# Patient Record
Sex: Female | Born: 1990 | Race: White | Hispanic: No | Marital: Single | Smoking: Never smoker
Health system: Southern US, Community
[De-identification: ages and names within clinical notes are randomized; demographics above are authoritative.]

## PROBLEM LIST (undated history)

## (undated) DIAGNOSIS — J45909 Unspecified asthma, uncomplicated: Secondary | ICD-10-CM

## (undated) DIAGNOSIS — L309 Dermatitis, unspecified: Secondary | ICD-10-CM

## (undated) HISTORY — PX: HAND SURGERY: SHX662

---

## 2011-01-16 ENCOUNTER — Inpatient Hospital Stay (INDEPENDENT_AMBULATORY_CARE_PROVIDER_SITE_OTHER)
Admission: RE | Admit: 2011-01-16 | Discharge: 2011-01-16 | Disposition: A | Discharge status: Home / Self Care | Payer: BC Managed Care – PPO | Source: Ambulatory Visit | Attending: Emergency Medicine | Admitting: Emergency Medicine

## 2011-01-16 DIAGNOSIS — G43009 Migraine without aura, not intractable, without status migrainosus: Secondary | ICD-10-CM

## 2011-03-23 ENCOUNTER — Emergency Department (HOSPITAL_COMMUNITY)
Admission: EM | Admit: 2011-03-23 | Discharge: 2011-03-23 | Disposition: A | Discharge status: Home / Self Care | Payer: BC Managed Care – PPO | Attending: Emergency Medicine | Admitting: Emergency Medicine

## 2011-03-23 ENCOUNTER — Emergency Department (HOSPITAL_COMMUNITY): Payer: BC Managed Care – PPO

## 2011-03-23 DIAGNOSIS — R109 Unspecified abdominal pain: Secondary | ICD-10-CM | POA: Insufficient documentation

## 2011-03-23 DIAGNOSIS — K5900 Constipation, unspecified: Secondary | ICD-10-CM | POA: Insufficient documentation

## 2011-03-23 LAB — BASIC METABOLIC PANEL
BUN: 9 mg/dL (ref 6–23)
Calcium: 8.7 mg/dL (ref 8.4–10.5)
Creatinine, Ser: 0.64 mg/dL (ref 0.50–1.10)
GFR calc non Af Amer: >60 mL/min (ref >60)
Glucose, Bld: 109 mg/dL — ABNORMAL HIGH (ref 70–99)
Sodium: 134 mEq/L — ABNORMAL LOW (ref 135–145)

## 2011-03-23 LAB — URINE MICROSCOPIC-ADD ON

## 2011-03-23 LAB — URINALYSIS, ROUTINE W REFLEX MICROSCOPIC
Glucose, UA: NEGATIVE mg/dL
Hgb urine dipstick: NEGATIVE
Ketones, ur: NEGATIVE mg/dL
Protein, ur: NEGATIVE mg/dL
pH: 8 (ref 5.0–8.0)

## 2011-03-23 LAB — DIFFERENTIAL
Basophils Relative: 0 % (ref 0–1)
Lymphs Abs: 1.2 10*3/uL (ref 0.7–4.0)
Monocytes Absolute: 0.4 10*3/uL (ref 0.1–1.0)
Monocytes Relative: 7 % (ref 3–12)
Neutro Abs: 4.5 10*3/uL (ref 1.7–7.7)

## 2011-03-23 LAB — CBC
HCT: 35.1 % — ABNORMAL LOW (ref 36.0–46.0)
Hemoglobin: 12.7 g/dL (ref 12.0–15.0)
MCH: 35.1 pg — ABNORMAL HIGH (ref 26.0–34.0)
MCHC: 36.2 g/dL — ABNORMAL HIGH (ref 30.0–36.0)
MCV: 97 fL (ref 78.0–100.0)

## 2012-09-03 IMAGING — CR DG ABDOMEN ACUTE W/ 1V CHEST
3 series · 3 of 3 positions shown · non-contrast
Comparison: None.

CLINICAL DATA: Abdominal pain.

ACUTE ABDOMEN SERIES (ABDOMEN 2 VIEW & CHEST 1 VIEW)

[w chest pa]
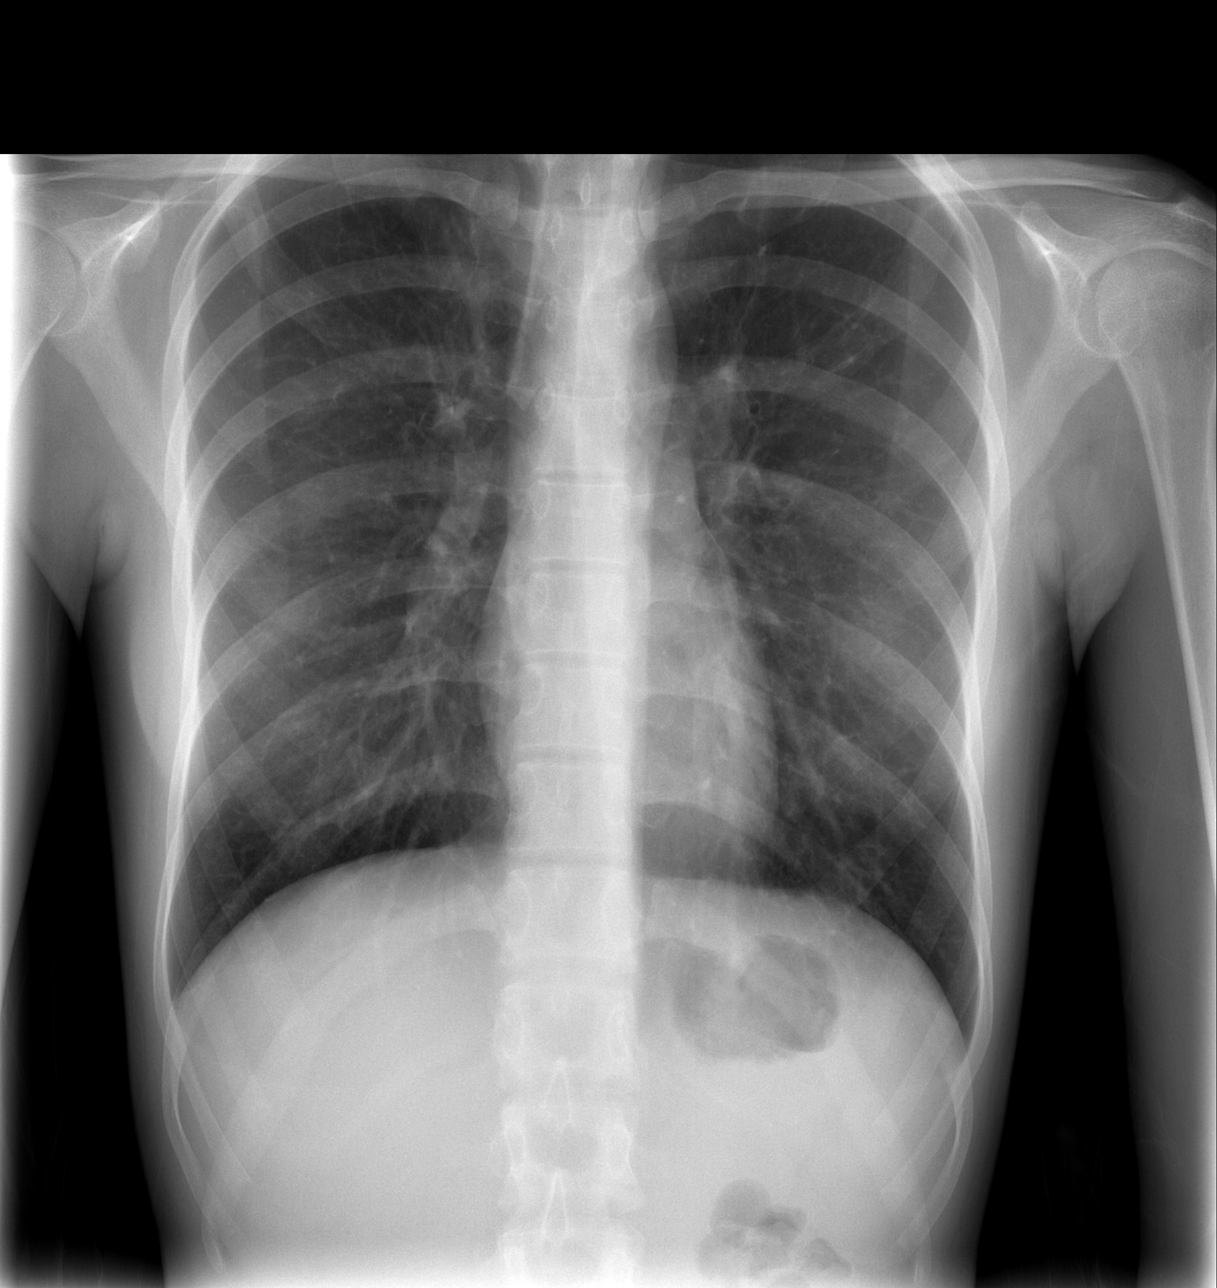

[w abdomen upright]
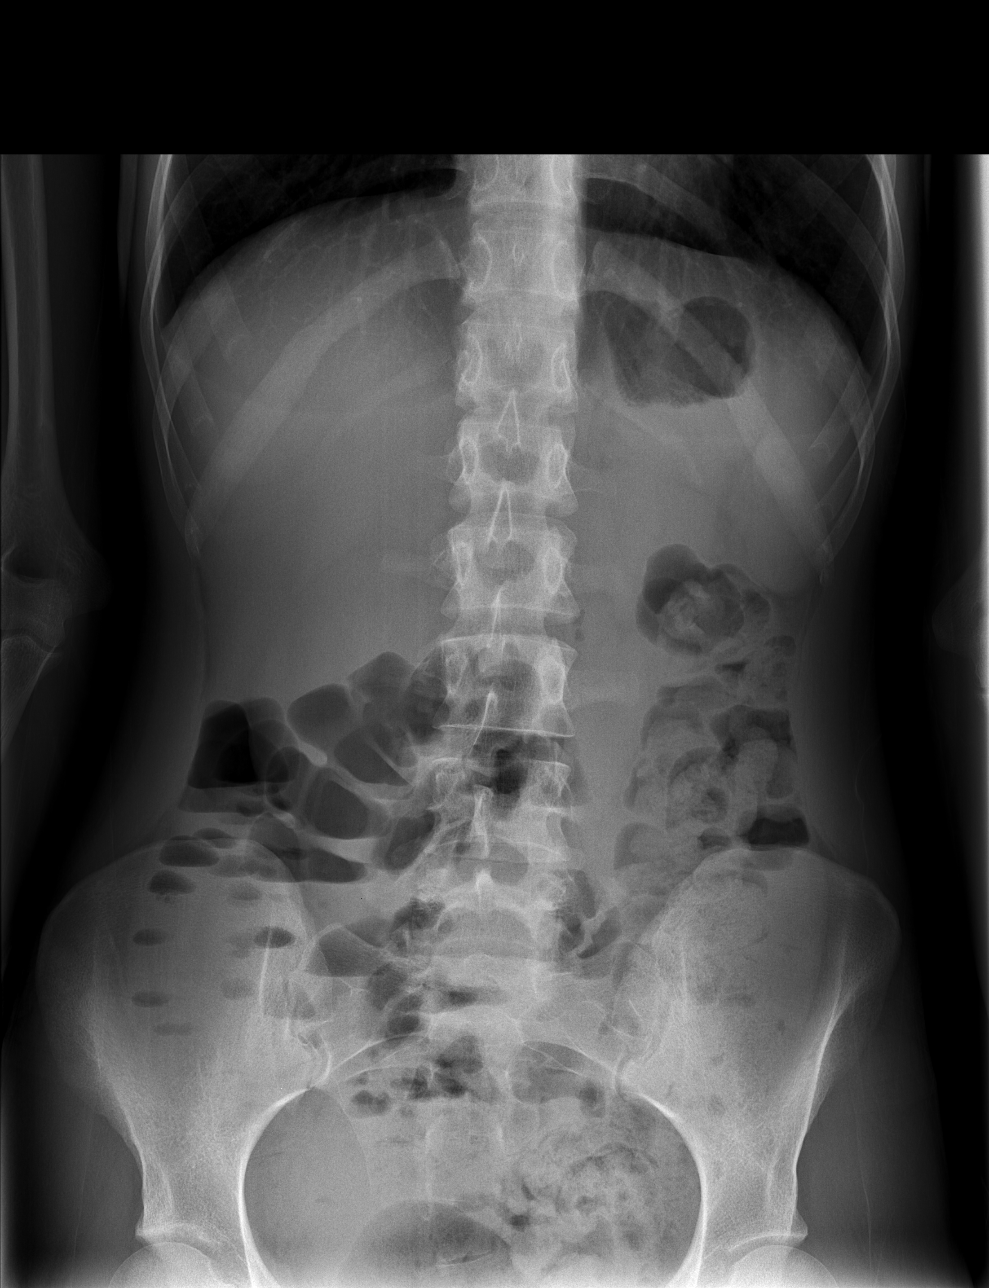

[t abdomen supine]
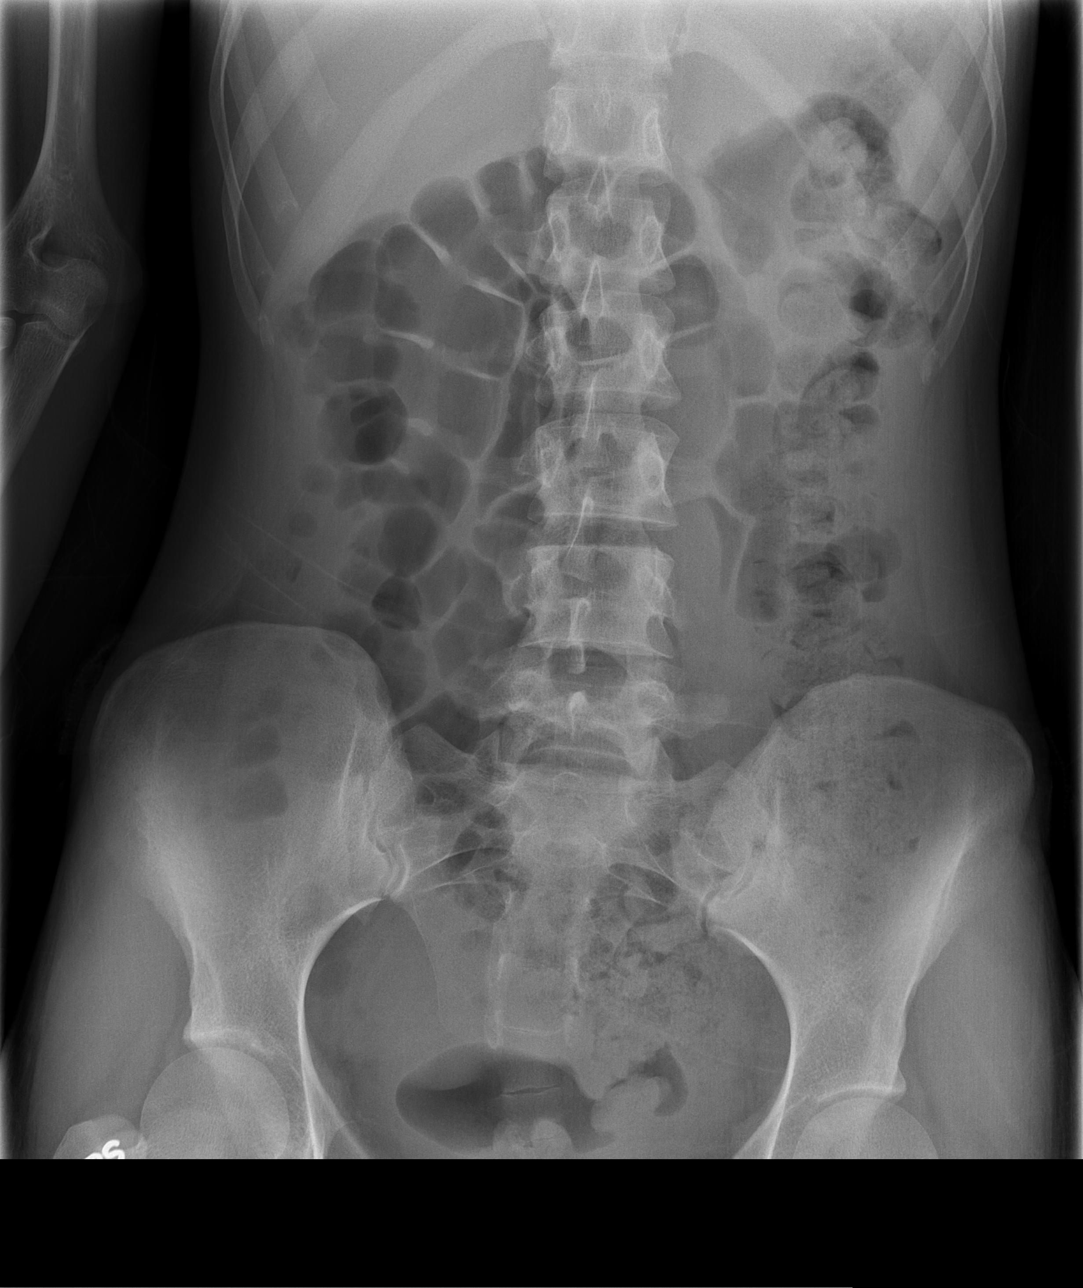

[3 of 3 positions shown; findings below may reference images not displayed]

FINDINGS: Moderate stool in the left side of the colon.  Mild
gaseous distention of the colon.  Free air, organomegaly or
suspicious calcification.

Heart and mediastinal contours are within normal limits.  No focal
opacities or effusions.  No acute bony abnormality.
IMPRESSION: No obstruction or free air.  Moderate stool burden.

## 2013-03-05 ENCOUNTER — Emergency Department (HOSPITAL_COMMUNITY)
Admission: EM | Admit: 2013-03-05 | Discharge: 2013-03-05 | Disposition: A | Discharge status: Home / Self Care | Payer: BC Managed Care – PPO | Source: Home / Self Care

## 2013-03-05 ENCOUNTER — Encounter (HOSPITAL_COMMUNITY): Payer: Self-pay | Admitting: Emergency Medicine

## 2013-03-05 DIAGNOSIS — N390 Urinary tract infection, site not specified: Secondary | ICD-10-CM

## 2013-03-05 LAB — POCT URINALYSIS DIP (DEVICE)
Bilirubin Urine: NEGATIVE
Glucose, UA: NEGATIVE mg/dL
Hgb urine dipstick: NEGATIVE
Nitrite: NEGATIVE
Protein, ur: 30 mg/dL — AB
Specific Gravity, Urine: >=1.03 (ref 1.005–1.030)
Urobilinogen, UA: 0.2 mg/dL (ref 0.0–1.0)
pH: 5.5 (ref 5.0–8.0)

## 2013-03-05 MED ORDER — CEPHALEXIN 500 MG PO CAPS: 500.0000 mg | ORAL_CAPSULE | Freq: Four times a day (QID) | ORAL | Status: DC

## 2013-03-05 NOTE — ED Provider Notes (Addendum)
History     CSN: [id number]  Arrival date & time 03/05/13  1659   None     Chief Complaint  Patient presents with  . Urinary Tract Infection    frequency and irrittation x 1 wk.     (Consider location/radiation/quality/duration/timing/severity/associated sxs/prior treatment) Patient is a 22 y.o. female presenting with urinary tract infection. The history is provided by the patient.  Urinary Tract Infection This is a recurrent problem. The current episode started more than 1 week ago. The problem has not changed since onset.Pertinent negatives include no chest pain and no abdominal pain.    History reviewed. No pertinent past medical history.  Past Surgical History  Procedure Laterality Date  . Hand surgery      History reviewed. No pertinent family history.  History  Substance Use Topics  . Smoking status: Never Smoker   . Smokeless tobacco: Not on file  . Alcohol Use: Yes    OB History   Grav Para Term Preterm Abortions TAB SAB Ect Mult Living                  Review of Systems  Constitutional: Negative.   Cardiovascular: Negative for chest pain.  Gastrointestinal: Negative for abdominal pain.  Genitourinary: Positive for dysuria, urgency and frequency. Negative for hematuria, vaginal bleeding, vaginal discharge and menstrual problem.    Allergies  Review of patient's allergies indicates no known allergies.  Home Medications   Current Outpatient Rx  Name  Route  Sig  Dispense  Refill  . cephALEXin (KEFLEX) 500 MG capsule   Oral   Take 1 capsule (500 mg total) by mouth 4 (four) times daily. Take all of medicine and drink lots of fluids   20 capsule   0     BP 95/68  Pulse 78  Temp(Src) 98.1 F (36.7 C) (Oral)  Resp 16  SpO2 100%  LMP 03/01/2013  Physical Exam  Nursing note and vitals reviewed. Constitutional: She is oriented to person, place, and time. She appears well-developed and well-nourished.  Neck: Normal range of motion. Neck supple.   Abdominal: Soft. Bowel sounds are normal. There is no tenderness.  Lymphadenopathy:    She has no cervical adenopathy.  Neurological: She is alert and oriented to person, place, and time.  Skin: Skin is warm and dry.    ED Course  Procedures (including critical care time)  Labs Reviewed  POCT URINALYSIS DIP (DEVICE) - Abnormal; Notable for the following:    Ketones, ur TRACE (*)    Protein, ur 30 (*)    Leukocytes, UA TRACE (*)    All other components within normal limits   No results found.   1. UTI (lower urinary tract infection)       MDM  U/a abnl        Linna Hoff, MD 03/05/13 1743  Linna Hoff, MD 03/05/13 1744

## 2013-03-05 NOTE — ED Notes (Signed)
Pt c/o frequent urination and irritation x 1 wk. Mild nausea. Denies pelvic or abdominal pain.  Pt has increased water intake and is drinking cranberry juice with no relief in symptoms.

## 2016-05-25 ENCOUNTER — Encounter (HOSPITAL_COMMUNITY): Payer: Self-pay | Admitting: Family Medicine

## 2016-05-25 ENCOUNTER — Ambulatory Visit (HOSPITAL_COMMUNITY)
Admission: EM | Admit: 2016-05-25 | Discharge: 2016-05-25 | Disposition: A | Discharge status: Home / Self Care | Payer: Self-pay | Attending: Family Medicine | Admitting: Family Medicine

## 2016-05-25 DIAGNOSIS — Z87440 Personal history of urinary (tract) infections: Secondary | ICD-10-CM | POA: Insufficient documentation

## 2016-05-25 DIAGNOSIS — N390 Urinary tract infection, site not specified: Secondary | ICD-10-CM | POA: Insufficient documentation

## 2016-05-25 LAB — POCT URINALYSIS DIP (DEVICE)
BILIRUBIN URINE: NEGATIVE
Glucose, UA: 100 mg/dL — AB
HGB URINE DIPSTICK: NEGATIVE
Ketones, ur: NEGATIVE mg/dL
Leukocytes, UA: NEGATIVE
NITRITE: POSITIVE — AB
PH: 6 (ref 5.0–8.0)
PROTEIN: 30 mg/dL — AB
Specific Gravity, Urine: 1.015 (ref 1.005–1.030)
UROBILINOGEN UA: 1 mg/dL (ref 0.0–1.0)

## 2016-05-25 MED ORDER — CIPROFLOXACIN HCL 500 MG PO TABS: 500.0000 mg | ORAL_TABLET | Freq: Two times a day (BID) | ORAL | 0 refills | Status: AC

## 2016-05-25 MED ORDER — PHENAZOPYRIDINE HCL 200 MG PO TABS: 200.0000 mg | ORAL_TABLET | Freq: Three times a day (TID) | ORAL | 0 refills | Status: AC

## 2016-05-25 NOTE — ED Provider Notes (Signed)
[facility]    CSN: [id number] Arrival date & time: 05/25/16  1142  First Provider Contact:  None       History   Chief Complaint Chief Complaint  Patient presents with  . Recurrent UTI    HPI Miranda Velasquez is a 25 y.o. female.   This is a 25 year old woman who is here for evaluation of urinary discomfort. [facility] couple months [facility]. At that time she had back pain as well as dysuria.  She's continued to have urinary frequency, and dysuria has gotten much worse the last several days. She's tried over-the-counter Azo type medications which have not significantly reduced her symptoms.  She is in a monogamous relationship for 8 years. She has no discharge. She has no nausea, vomiting, fever, or night sweats. Periods are regular.      History reviewed. No pertinent past medical history.  There are no active problems to display for this patient.   Past Surgical History:  Procedure Laterality Date  . HAND SURGERY      OB History    No data available       Home Medications    Prior to Admission medications   Medication Sig Start Date End Date Taking? Authorizing Provider  ciprofloxacin (CIPRO) 500 MG tablet Take 1 tablet (500 mg total) by mouth 2 (two) times daily. 05/25/16   Elvina SidleKurt Isidoro Santillana, MD  phenazopyridine (PYRIDIUM) 200 MG tablet Take 1 tablet (200 mg total) by mouth 3 (three) times daily. 05/25/16   Elvina SidleKurt Virdie Penning, MD    Family History History reviewed. No pertinent family history.  Social History Social History  Substance Use Topics  . Smoking status: Never Smoker  . Smokeless tobacco: Never Used  . Alcohol use Yes     Allergies   Review of patient's allergies indicates no known allergies.   Review of Systems Review of Systems  Constitutional: Negative.   HENT: Negative.   Eyes: Negative.   Respiratory: Negative.   Cardiovascular: Negative.   Gastrointestinal: Negative.     Genitourinary: Positive for dysuria and frequency. Negative for flank pain, pelvic pain, vaginal bleeding and vaginal discharge.  Musculoskeletal: Negative.   Neurological: Negative.      Physical Exam Triage Vital Signs ED Triage Vitals  Enc Vitals Group     BP 05/25/16 1220 140/77     Pulse Rate 05/25/16 1220 98     Resp 05/25/16 1220 18     Temp 05/25/16 1220 98.6 F (37 C)     Temp Source 05/25/16 1220 Oral     SpO2 05/25/16 1220 98 %     Weight --      Height --      Head Circumference --      Peak Flow --      Pain Score 05/25/16 1221 7     Pain Loc --      Pain Edu? --      Excl. in GC? --    No data found.   Updated Vital Signs BP 140/77   Pulse 98   Temp 98.6 F (37 C) (Oral)   Resp 18   LMP 05/11/2016   SpO2 98%   Visual Acuity Right Eye Distance:   Left Eye Distance:   Bilateral Distance:    Right Eye Near:   Left Eye Near:    Bilateral Near:     Physical Exam  Constitutional: She appears well-developed and well-nourished.  Nursing note and vitals reviewed.    UC Treatments / Results  Labs (all labs ordered are listed, but only abnormal results are displayed) Labs Reviewed  POCT URINALYSIS DIP (DEVICE) - Abnormal; Notable for the following:       Result Value   Glucose, UA 100 (*)    Protein, ur 30 (*)    Nitrite POSITIVE (*)    All other components within normal limits    EKG  EKG Interpretation None       Radiology No results found.  Procedures Procedures (including critical care time)  Medications Ordered in UC Medications - No data to display   Initial Impression / Assessment and Plan / UC Course  I have reviewed the triage vital signs and the nursing notes.  Pertinent labs & imaging results that were available during my care of the patient were reviewed by me and considered in my medical decision making (see chart for details).  Clinical Course    Final Clinical Impressions(s) / UC Diagnoses   Final  diagnoses:  UTI (lower urinary tract infection)    New Prescriptions New Prescriptions   CIPROFLOXACIN (CIPRO) 500 MG TABLET    Take 1 tablet (500 mg total) by mouth 2 (two) times daily.   PHENAZOPYRIDINE (PYRIDIUM) 200 MG TABLET    Take 1 tablet (200 mg total) by mouth 3 (three) times daily.     Elvina Sidle, MD 05/25/16 1249

## 2016-05-25 NOTE — ED Triage Notes (Signed)
Pt here for reoccuring UTI symptoms. sts was treated at the end of June for UTI and never really  Went away.

## 2016-05-26 LAB — URINE CULTURE

## 2016-05-29 ENCOUNTER — Telehealth (HOSPITAL_COMMUNITY): Payer: Self-pay | Admitting: Emergency Medicine

## 2016-05-29 NOTE — Telephone Encounter (Signed)
-----   Message from Eustace MooreLaura W Murray, MD sent at 05/27/2016  1:01 PM EDT ----- Clinical staff, please let patient know that urine culture does not clearly demonstrate a UTI.   Recheck for further evaluation if symptoms persist.  LM

## 2016-05-29 NOTE — Telephone Encounter (Signed)
Called mobile number but VM was not set up.  Need to see how pt is doing and to give lab results from recent visit on 9/8 Also let pt know labs can be obtained from MyChart

## 2016-06-13 ENCOUNTER — Encounter (HOSPITAL_COMMUNITY): Payer: Self-pay | Admitting: Emergency Medicine

## 2016-06-13 ENCOUNTER — Ambulatory Visit (HOSPITAL_COMMUNITY)
Admission: EM | Admit: 2016-06-13 | Discharge: 2016-06-13 | Disposition: A | Discharge status: Home / Self Care | Payer: Self-pay | Attending: Internal Medicine | Admitting: Internal Medicine

## 2016-06-13 DIAGNOSIS — R300 Dysuria: Secondary | ICD-10-CM

## 2016-06-13 HISTORY — DX: Dermatitis, unspecified: L30.9

## 2016-06-13 HISTORY — DX: Unspecified asthma, uncomplicated: J45.909

## 2016-06-13 NOTE — Telephone Encounter (Signed)
Pt is here to be seen for similar sx today 9/27  Results given while pt was waiting.   Pt verb understanding.

## 2016-06-13 NOTE — ED Provider Notes (Signed)
CSN: [id number]     Arrival date & time 06/13/16  1354 History   First MD Initiated Contact with Patient 06/13/16 1542     Chief Complaint  Patient presents with  . Back Pain   (Consider location/radiation/quality/duration/timing/severity/associated sxs/prior Treatment) HPI Pt is a 25 y/o female with continued UTI symptoms. seh states she has been seen multiple occasions, and was told today that her urine culture is negative. At this point pt starts to yell at me. She then states that she does not need another test for her urine, that she needs to find out what is wrong with her. She states she does not have the money to visit a specialist. She then gets off the bed and walks out of the room and starts crying in the hall outside the patient rooms. She then starts yelling that her keys are missing and walks back into the room. She starts yelling that no one cares for her, we are not taking her seriously. She then demands her discharge papers with the name of a urologist.  Past Medical History:  Diagnosis Date  . Asthma   . Eczema    Past Surgical History:  Procedure Laterality Date  . HAND SURGERY     History reviewed. No pertinent family history. Social History  Substance Use Topics  . Smoking status: Never Smoker  . Smokeless tobacco: Never Used  . Alcohol use Yes   OB History    No data available     Review of Systems Unable to perform Allergies  Review of patient's allergies indicates no known allergies.  Home Medications   Prior to Admission medications   Medication Sig Start Date End Date Taking? Authorizing Provider  ciprofloxacin (CIPRO) 500 MG tablet Take 1 tablet (500 mg total) by mouth 2 (two) times daily. 05/25/16   Elvina SidleKurt Lauenstein, MD  phenazopyridine (PYRIDIUM) 200 MG tablet Take 1 tablet (200 mg total) by mouth 3 (three) times daily. 05/25/16   Elvina SidleKurt Lauenstein, MD   Meds Ordered and Administered this Visit  Medications - No data to display  BP 119/77 (BP  Location: Right Arm)   Pulse (!) 59   Temp 98.2 F (36.8 C) (Oral)   Resp 16   LMP 05/01/2016 (Exact Date)   SpO2 100%  No data found.   Physical Exam Not performed.  Urgent Care Course   Clinical Course   No testing performed. Discussion of urine culture, upset patient.  Procedures (including critical care time)  Labs Review Labs Reviewed - No data to display  Imaging Review No results found.   Visual Acuity Review  Right Eye Distance:   Left Eye Distance:   Bilateral Distance:    Right Eye Near:   Left Eye Near:    Bilateral Near:         MDM   1. Dysuria    Pt leaves without complete history or physical being done.     Tharon AquasFrank C Patrick, PA 06/13/16 1745

## 2016-06-13 NOTE — ED Triage Notes (Signed)
[facility] a [facility] lower [facility] pain and abdominal pain that has been ongoing for 3 weeks.

## 2016-06-13 NOTE — Discharge Instructions (Signed)
We are happy to retest your urine today, but if negative it would be in your best interest to be seen by urology.   Your last culture was negative.
